# Patient Record
Sex: Female | Born: 1990 | Race: Black or African American | Hispanic: No | Marital: Single | Smoking: Current every day smoker
Health system: Southern US, Community
[De-identification: ages and names within clinical notes are randomized; demographics above are authoritative.]

## PROBLEM LIST (undated history)

## (undated) ENCOUNTER — Emergency Department (HOSPITAL_COMMUNITY): Payer: No Typology Code available for payment source | Source: Home / Self Care

## (undated) DIAGNOSIS — Z5189 Encounter for other specified aftercare: Secondary | ICD-10-CM

## (undated) DIAGNOSIS — F419 Anxiety disorder, unspecified: Secondary | ICD-10-CM

## (undated) DIAGNOSIS — D649 Anemia, unspecified: Secondary | ICD-10-CM

## (undated) HISTORY — DX: Anxiety disorder, unspecified: F41.9

## (undated) HISTORY — DX: Encounter for other specified aftercare: Z51.89

---

## 2008-05-01 ENCOUNTER — Emergency Department (HOSPITAL_COMMUNITY): Admission: EM | Admit: 2008-05-01 | Discharge: 2008-05-01 | Payer: Self-pay | Admitting: Emergency Medicine

## 2008-09-20 ENCOUNTER — Emergency Department (HOSPITAL_COMMUNITY): Admission: EM | Admit: 2008-09-20 | Discharge: 2008-09-20 | Payer: Self-pay | Admitting: Emergency Medicine

## 2008-10-04 ENCOUNTER — Emergency Department (HOSPITAL_COMMUNITY): Admission: EM | Admit: 2008-10-04 | Discharge: 2008-10-04 | Payer: Self-pay | Admitting: Emergency Medicine

## 2009-05-31 ENCOUNTER — Emergency Department (HOSPITAL_COMMUNITY): Admission: EM | Admit: 2009-05-31 | Discharge: 2009-05-31 | Payer: Self-pay | Admitting: Emergency Medicine

## 2009-10-19 ENCOUNTER — Emergency Department (HOSPITAL_COMMUNITY): Admission: EM | Admit: 2009-10-19 | Discharge: 2009-10-19 | Payer: Self-pay | Admitting: Emergency Medicine

## 2010-01-26 ENCOUNTER — Emergency Department (HOSPITAL_COMMUNITY): Admission: EM | Admit: 2010-01-26 | Discharge: 2009-03-25 | Payer: Self-pay | Admitting: Emergency Medicine

## 2010-05-05 LAB — POCT PREGNANCY, URINE: Preg Test, Ur: NEGATIVE

## 2010-05-05 LAB — WET PREP, GENITAL: Trich, Wet Prep: NONE SEEN

## 2010-05-05 LAB — GC/CHLAMYDIA PROBE AMP, GENITAL: GC Probe Amp, Genital: NEGATIVE

## 2010-05-10 LAB — RAPID STREP SCREEN (MED CTR MEBANE ONLY): Streptococcus, Group A Screen (Direct): POSITIVE — AB

## 2010-05-27 LAB — GC/CHLAMYDIA PROBE AMP, GENITAL
Chlamydia, DNA Probe: POSITIVE — AB
GC Probe Amp, Genital: POSITIVE — AB

## 2010-05-27 LAB — WET PREP, GENITAL
Clue Cells Wet Prep HPF POC: NONE SEEN
Trich, Wet Prep: NONE SEEN
Yeast Wet Prep HPF POC: NONE SEEN

## 2010-05-27 LAB — URINALYSIS, ROUTINE W REFLEX MICROSCOPIC
Glucose, UA: NEGATIVE mg/dL
Glucose, UA: NEGATIVE mg/dL
Ketones, ur: NEGATIVE mg/dL
Nitrite: NEGATIVE
Protein, ur: NEGATIVE mg/dL
Specific Gravity, Urine: 1.013 (ref 1.005–1.030)
Urobilinogen, UA: 1 mg/dL (ref 0.0–1.0)
pH: 6.5 (ref 5.0–8.0)

## 2010-05-27 LAB — POCT PREGNANCY, URINE
Preg Test, Ur: NEGATIVE
Preg Test, Ur: NEGATIVE

## 2010-06-01 LAB — URINALYSIS, ROUTINE W REFLEX MICROSCOPIC
Bilirubin Urine: NEGATIVE
Hgb urine dipstick: NEGATIVE
Ketones, ur: 15 mg/dL — AB
Protein, ur: NEGATIVE mg/dL
Urobilinogen, UA: 1 mg/dL (ref 0.0–1.0)

## 2010-06-01 LAB — POCT PREGNANCY, URINE: Preg Test, Ur: NEGATIVE

## 2010-06-01 LAB — URINE MICROSCOPIC-ADD ON

## 2011-03-01 ENCOUNTER — Inpatient Hospital Stay (HOSPITAL_COMMUNITY)
Admission: AD | Admit: 2011-03-01 | Discharge: 2011-03-01 | Disposition: A | Discharge status: Home / Self Care | Payer: Medicaid Other | Source: Ambulatory Visit | Attending: Obstetrics & Gynecology | Admitting: Obstetrics & Gynecology

## 2011-03-01 ENCOUNTER — Encounter (HOSPITAL_COMMUNITY): Payer: Self-pay | Admitting: *Deleted

## 2011-03-01 DIAGNOSIS — N949 Unspecified condition associated with female genital organs and menstrual cycle: Secondary | ICD-10-CM | POA: Insufficient documentation

## 2011-03-01 DIAGNOSIS — N938 Other specified abnormal uterine and vaginal bleeding: Secondary | ICD-10-CM | POA: Insufficient documentation

## 2011-03-01 DIAGNOSIS — A499 Bacterial infection, unspecified: Secondary | ICD-10-CM | POA: Insufficient documentation

## 2011-03-01 DIAGNOSIS — N898 Other specified noninflammatory disorders of vagina: Secondary | ICD-10-CM

## 2011-03-01 DIAGNOSIS — N760 Acute vaginitis: Secondary | ICD-10-CM | POA: Insufficient documentation

## 2011-03-01 DIAGNOSIS — N939 Abnormal uterine and vaginal bleeding, unspecified: Secondary | ICD-10-CM

## 2011-03-01 DIAGNOSIS — B9689 Other specified bacterial agents as the cause of diseases classified elsewhere: Secondary | ICD-10-CM | POA: Insufficient documentation

## 2011-03-01 HISTORY — DX: Anemia, unspecified: D64.9

## 2011-03-01 LAB — CBC
HCT: 38.5 % (ref 36.0–46.0)
Hemoglobin: 12.7 g/dL (ref 12.0–15.0)
MCV: 82.8 fL (ref 78.0–100.0)
Platelets: 335 10*3/uL (ref 150–400)
RBC: 4.65 MIL/uL (ref 3.87–5.11)
WBC: 13 10*3/uL — ABNORMAL HIGH (ref 4.0–10.5)

## 2011-03-01 LAB — URINALYSIS, ROUTINE W REFLEX MICROSCOPIC
Glucose, UA: NEGATIVE mg/dL
Ketones, ur: NEGATIVE mg/dL
Leukocytes, UA: NEGATIVE
Protein, ur: NEGATIVE mg/dL

## 2011-03-01 LAB — WET PREP, GENITAL

## 2011-03-01 LAB — URINE MICROSCOPIC-ADD ON

## 2011-03-01 MED ORDER — METRONIDAZOLE 500 MG PO TABS: 500.0000 mg | ORAL_TABLET | Freq: Two times a day (BID) | ORAL | Status: AC

## 2011-03-01 NOTE — Progress Notes (Signed)
Patient state she has been on Depo for 6 years and has not had a period in that time. Next injection due 1-18. Started bleeding on 1-3 and has had moderate bleeding every day since. Wears tampons and changes about 6 times a day and has some moderate clots. Started having abdominal cramping about the same time as the bleeding started.

## 2011-03-01 NOTE — ED Provider Notes (Signed)
History   Pt presents today c/o irregular vag bleeding. She states she has been on Depo-provera for the past 5 years or so and has not had menses during that time. However, she began bleeding on 02/22/11 and has continued to have bleeding "on and off." She also c/o some irregular cramping. She denies vag irritation, dc, fever. She denies recent intercourse.  Chief Complaint  Patient presents with  . Vaginal Bleeding  . Abdominal Pain   HPI  OB History    Grav Para Term Preterm Abortions TAB SAB Ect Mult Living   3 3 1 2      3       Past Medical History  Diagnosis Date  . Anemia     Past Surgical History  Procedure Date  . Cesarean section     No family history on file.  History  Substance Use Topics  . Smoking status: Current Everyday Smoker -- 0.2 packs/day for 9 years    Types: Cigarettes  . Smokeless tobacco: Not on file  . Alcohol Use: Yes     occasional alcohol    Allergies: No Known Allergies  Prescriptions prior to admission  Medication Sig Dispense Refill  . acetaminophen (TYLENOL) 325 MG tablet Take 650 mg by mouth daily as needed. For pain      . Ibuprofen (MIDOL) 200 MG CAPS Take 1 capsule by mouth daily as needed. For cramps      . Multiple Vitamin (MULITIVITAMIN WITH MINERALS) TABS Take 1 tablet by mouth daily.        Review of Systems  Constitutional: Negative for fever.  Eyes: Negative for blurred vision.  Cardiovascular: Negative for chest pain and palpitations.  Gastrointestinal: Positive for abdominal pain. Negative for nausea, vomiting, diarrhea and constipation.  Genitourinary: Negative for dysuria, urgency, frequency and hematuria.  Neurological: Negative for dizziness and headaches.  Psychiatric/Behavioral: Negative for depression and suicidal ideas.   Physical Exam   Blood pressure 124/78, pulse 84, temperature 99 F (37.2 C), temperature source Oral, resp. rate 16, height 5\' 3"  (1.6 m), weight 126 lb 6.4 oz (57.335 kg), last menstrual  period 02/22/2011, SpO2 98.00%.  Physical Exam  Nursing note and vitals reviewed. Constitutional: She is oriented to person, place, and time. She appears well-developed and well-nourished. No distress.  HENT:  Head: Normocephalic and atraumatic.  Eyes: EOM are normal. Pupils are equal, round, and reactive to light.  GI: Soft. She exhibits no distension and no mass. There is no tenderness. There is no rebound and no guarding.  Genitourinary: There is bleeding around the vagina. No vaginal discharge found.       Uterus NL size and shape. No adnexal masses. Pt slightly tender to palpation.  Neurological: She is alert and oriented to person, place, and time.  Skin: Skin is warm and dry. She is not diaphoretic.  Psychiatric: She has a normal mood and affect. Her behavior is normal. Judgment and thought content normal.    MAU Course  Procedures  Wet prep and GC/Chlamydia cultures done.  Results for orders placed during the hospital encounter of 03/01/11 (from the past 24 hour(s))  CBC     Status: Abnormal   Collection Time   03/01/11  8:57 AM      Component Value Range   WBC 13.0 (*) 4.0 - 10.5 (K/uL)   RBC 4.65  3.87 - 5.11 (MIL/uL)   Hemoglobin 12.7  12.0 - 15.0 (g/dL)   HCT 40.3  47.4 - 25.9 (%)  MCV 82.8  78.0 - 100.0 (fL)   MCH 27.3  26.0 - 34.0 (pg)   MCHC 33.0  30.0 - 36.0 (g/dL)   RDW 16.1  09.6 - 04.5 (%)   Platelets 335  150 - 400 (K/uL)  URINALYSIS, ROUTINE W REFLEX MICROSCOPIC     Status: Abnormal   Collection Time   03/01/11  9:22 AM      Component Value Range   Color, Urine YELLOW  YELLOW    APPearance CLEAR  CLEAR    Specific Gravity, Urine 1.025  1.005 - 1.030    pH 6.0  5.0 - 8.0    Glucose, UA NEGATIVE  NEGATIVE (mg/dL)   Hgb urine dipstick MODERATE (*) NEGATIVE    Bilirubin Urine NEGATIVE  NEGATIVE    Ketones, ur NEGATIVE  NEGATIVE (mg/dL)   Protein, ur NEGATIVE  NEGATIVE (mg/dL)   Urobilinogen, UA 0.2  0.0 - 1.0 (mg/dL)   Nitrite NEGATIVE  NEGATIVE     Leukocytes, UA NEGATIVE  NEGATIVE   URINE MICROSCOPIC-ADD ON     Status: Abnormal   Collection Time   03/01/11  9:22 AM      Component Value Range   Squamous Epithelial / LPF MANY (*) RARE    RBC / HPF 3-6  <3 (RBC/hpf)   Bacteria, UA RARE  RARE   POCT PREGNANCY, URINE     Status: Normal   Collection Time   03/01/11  9:28 AM      Component Value Range   Preg Test, Ur NEGATIVE    WET PREP, GENITAL     Status: Abnormal   Collection Time   03/01/11  9:50 AM      Component Value Range   Yeast, Wet Prep NONE SEEN  NONE SEEN    Trich, Wet Prep NONE SEEN  NONE SEEN    Clue Cells, Wet Prep MANY (*) NONE SEEN    WBC, Wet Prep HPF POC FEW (*) NONE SEEN      Assessment and Plan  ABN vag bleeding: bleeding likely a result of Depo-provera use. Her next injection is due on 03/15/11. Advised her to f/u with her provider.  BV: discussed with pt at length. Will tx with BV. Discussed antabuse reaction. Discussed diet, activity, risks, and precautions.  Clinton Gallant. Darelle Kings III, DrHSc, MPAS, PA-C  03/01/2011, 9:53 AM   Henrietta Hoover, PA 03/01/11 [id number]

## 2011-03-02 LAB — GC/CHLAMYDIA PROBE AMP, GENITAL: GC Probe Amp, Genital: NEGATIVE

## 2011-03-07 NOTE — ED Provider Notes (Signed)
Agree with above note.  Chael Urenda H. 03/07/2011 7:57 AM

## 2011-09-09 ENCOUNTER — Emergency Department (HOSPITAL_COMMUNITY)
Admission: EM | Admit: 2011-09-09 | Discharge: 2011-09-09 | Disposition: A | Discharge status: Home / Self Care | Payer: Medicaid Other | Attending: Emergency Medicine | Admitting: Emergency Medicine

## 2011-09-09 ENCOUNTER — Encounter (HOSPITAL_COMMUNITY): Payer: Self-pay | Admitting: *Deleted

## 2011-09-09 DIAGNOSIS — N938 Other specified abnormal uterine and vaginal bleeding: Secondary | ICD-10-CM | POA: Insufficient documentation

## 2011-09-09 DIAGNOSIS — N949 Unspecified condition associated with female genital organs and menstrual cycle: Secondary | ICD-10-CM | POA: Insufficient documentation

## 2011-09-09 LAB — POCT I-STAT, CHEM 8
BUN: 3 mg/dL — ABNORMAL LOW (ref 6–23)
Calcium, Ion: 1.22 mmol/L (ref 1.12–1.23)
Chloride: 110 mEq/L (ref 96–112)
Creatinine, Ser: 0.9 mg/dL (ref 0.50–1.10)
Glucose, Bld: 97 mg/dL (ref 70–99)

## 2011-09-09 LAB — URINALYSIS, ROUTINE W REFLEX MICROSCOPIC
Bilirubin Urine: NEGATIVE
Glucose, UA: NEGATIVE mg/dL
Ketones, ur: NEGATIVE mg/dL
Protein, ur: NEGATIVE mg/dL
pH: 6 (ref 5.0–8.0)

## 2011-09-09 LAB — WET PREP, GENITAL
Clue Cells Wet Prep HPF POC: NONE SEEN
Trich, Wet Prep: NONE SEEN
Yeast Wet Prep HPF POC: NONE SEEN

## 2011-09-09 LAB — URINE MICROSCOPIC-ADD ON

## 2011-09-09 LAB — POCT PREGNANCY, URINE: Preg Test, Ur: NEGATIVE

## 2011-09-09 NOTE — ED Provider Notes (Signed)
History     CSN: [id number]  Arrival date & time 09/09/11  1054   First MD Initiated Contact with Patient 09/09/11 1223      Chief complaint: heavy vaginal bleeding  (Consider location/radiation/quality/duration/timing/severity/associated sxs/prior treatment) The history is provided by the patient.  pt states yesterday onset heavy vaginal bleeding and lower abd/pelvic cramping bil. Feels similar to prior menstrual cramps but states bleeding heavier. Has used 5-6 tampons today. No vaginal discharge. No fever or chills. No dysuria or flank pain. No hx fibroids, ovarian cysts, or endometriosis. Pain, crampy, comes and goes, does not radiating, no specific exacerbating or alleviating factors. Is on depo, unsure when last got. States since on depo irregular periods/bleeding, states last period/vaginal bleeding was 3-4 months ago. States prior eval at [facility] for same.     Past Medical History  Diagnosis Date  . Anemia     Past Surgical History  Procedure Date  . Cesarean section     History reviewed. No pertinent family history.  History  Substance Use Topics  . Smoking status: Current Everyday Smoker -- 1.0 packs/day for 9 years    Types: Cigarettes  . Smokeless tobacco: Current User  . Alcohol Use: Yes     occasional alcohol    OB History    Grav Para Term Preterm Abortions TAB SAB Ect Mult Living   3 3 1 2      3       Review of Systems  Constitutional: Negative for fever and chills.  Gastrointestinal: Negative for vomiting and blood in stool.  Genitourinary: Negative for dysuria, hematuria, vaginal discharge and vaginal pain.  Musculoskeletal: Negative for back pain.  Neurological: Negative for syncope and light-headedness.    Allergies  Review of patient's allergies indicates no known allergies.  Home Medications   Current Outpatient Rx  Name Route Sig Dispense Refill  . ADULT MULTIVITAMIN W/MINERALS CH Oral Take 1 tablet by mouth daily.      BP  121/75  Pulse 89  Temp 99 F (37.2 C) (Oral)  Resp 16  SpO2 98%  Physical Exam  Nursing note and vitals reviewed. Constitutional: She appears well-developed and well-nourished. No distress.  Eyes: Conjunctivae are normal. No scleral icterus.  Neck: Neck supple. No tracheal deviation present.  Cardiovascular: Normal rate.   Pulmonary/Chest: Effort normal. No respiratory distress.  Abdominal: Soft. Normal appearance and bowel sounds are normal. She exhibits no distension and no mass. There is no tenderness.  Musculoskeletal: She exhibits no edema.  Neurological: She is alert.  Skin: Skin is warm and dry. No rash noted.  Psychiatric: She has a normal mood and affect.    ED Course  Procedures (including critical care time)  Labs Reviewed  POCT I-STAT, CHEM 8 - Abnormal; Notable for the following:    BUN 3 (*)     All other components within normal limits  POCT PREGNANCY, URINE  URINALYSIS, ROUTINE W REFLEX MICROSCOPIC    Results for orders placed during the hospital encounter of 09/09/11  URINALYSIS, ROUTINE W REFLEX MICROSCOPIC      Component Value Range   Color, Urine YELLOW  YELLOW   APPearance CLOUDY (*) CLEAR   Specific Gravity, Urine 1.009  1.005 - 1.030   pH 6.0  5.0 - 8.0   Glucose, UA NEGATIVE  NEGATIVE mg/dL   Hgb urine dipstick LARGE (*) NEGATIVE   Bilirubin Urine NEGATIVE  NEGATIVE   Ketones, ur NEGATIVE  NEGATIVE mg/dL   Protein, ur NEGATIVE  NEGATIVE  mg/dL   Urobilinogen, UA 1.0  0.0 - 1.0 mg/dL   Nitrite NEGATIVE  NEGATIVE   Leukocytes, UA TRACE (*) NEGATIVE  POCT I-STAT, CHEM 8      Component Value Range   Sodium 143  135 - 145 mEq/L   Potassium 4.0  3.5 - 5.1 mEq/L   Chloride 110  96 - 112 mEq/L   BUN 3 (*) 6 - 23 mg/dL   Creatinine, Ser 7.84  0.50 - 1.10 mg/dL   Glucose, Bld 97  70 - 99 mg/dL   Calcium, Ion 6.96  2.95 - 1.23 mmol/L   TCO2 21  0 - 100 mmol/L   Hemoglobin 14.3  12.0 - 15.0 g/dL   HCT 28.4  13.2 - 44.0 %  POCT PREGNANCY, URINE       Component Value Range   Preg Test, Ur NEGATIVE  NEGATIVE  URINE MICROSCOPIC-ADD ON      Component Value Range   Squamous Epithelial / LPF RARE  RARE   WBC, UA 0-2  <3 WBC/hpf   RBC / HPF TOO NUMEROUS TO COUNT  <3 RBC/hpf  WET PREP, GENITAL      Component Value Range   Yeast Wet Prep HPF POC NONE SEEN  NONE SEEN   Trich, Wet Prep NONE SEEN  NONE SEEN   Clue Cells Wet Prep HPF POC NONE SEEN  NONE SEEN   WBC, Wet Prep HPF POC FEW (*) NONE SEEN     MDM  Labs sent from triage.   Reviewed nursing notes and prior charts for additional history.   Scant blood on pelvic exam, no active bleeding.   abd soft nt. hgb 14. u preg neg. Stable for d/c.        Suzi Roots, MD 09/09/11 1341
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
## 2011-09-15 ENCOUNTER — Emergency Department (HOSPITAL_COMMUNITY)
Admission: EM | Admit: 2011-09-15 | Discharge: 2011-09-15 | Disposition: A | Discharge status: Home / Self Care | Payer: No Typology Code available for payment source | Attending: Emergency Medicine | Admitting: Emergency Medicine

## 2011-09-15 ENCOUNTER — Emergency Department (HOSPITAL_COMMUNITY): Payer: No Typology Code available for payment source

## 2011-09-15 ENCOUNTER — Encounter (HOSPITAL_COMMUNITY): Payer: Self-pay | Admitting: Emergency Medicine

## 2011-09-15 DIAGNOSIS — F17200 Nicotine dependence, unspecified, uncomplicated: Secondary | ICD-10-CM | POA: Insufficient documentation

## 2011-09-15 DIAGNOSIS — M542 Cervicalgia: Secondary | ICD-10-CM | POA: Insufficient documentation

## 2011-09-15 DIAGNOSIS — S161XXA Strain of muscle, fascia and tendon at neck level, initial encounter: Secondary | ICD-10-CM

## 2011-09-15 DIAGNOSIS — T148XXA Other injury of unspecified body region, initial encounter: Secondary | ICD-10-CM

## 2011-09-15 DIAGNOSIS — S139XXA Sprain of joints and ligaments of unspecified parts of neck, initial encounter: Secondary | ICD-10-CM | POA: Insufficient documentation

## 2011-09-15 DIAGNOSIS — Y92410 Unspecified street and highway as the place of occurrence of the external cause: Secondary | ICD-10-CM | POA: Insufficient documentation

## 2011-09-15 MED ORDER — IBUPROFEN 800 MG PO TABS
800.0000 mg | ORAL_TABLET | Freq: Once | ORAL | Status: AC
  Administered 2011-09-15: 800 mg via ORAL
  Filled 2011-09-15: qty 1

## 2011-09-15 MED ORDER — METHOCARBAMOL 500 MG PO TABS: 500.0000 mg | ORAL_TABLET | Freq: Two times a day (BID) | ORAL | Status: AC

## 2011-09-15 MED ORDER — IBUPROFEN 600 MG PO TABS: 600.0000 mg | ORAL_TABLET | Freq: Four times a day (QID) | ORAL | Status: AC | PRN

## 2011-09-15 MED ORDER — METOCLOPRAMIDE HCL 5 MG/ML IJ SOLN
10.0000 mg | Freq: Once | INTRAMUSCULAR | Status: AC
  Administered 2011-09-15: 10 mg via INTRAMUSCULAR
  Filled 2011-09-15: qty 2

## 2011-09-15 NOTE — ED Notes (Signed)
Patient given discharge instructions, information, prescriptions, and diet order. Patient states that they adequately understand discharge information given and to return to ED if symptoms return or worsen.     
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
## 2011-09-15 NOTE — ED Notes (Signed)
[place] collar on patient at this time.
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
## 2011-09-20 ENCOUNTER — Encounter (HOSPITAL_COMMUNITY): Payer: Self-pay | Admitting: Family Medicine

## 2011-09-20 ENCOUNTER — Emergency Department (HOSPITAL_COMMUNITY)
Admission: EM | Admit: 2011-09-20 | Discharge: 2011-09-20 | Disposition: A | Discharge status: Home / Self Care | Payer: No Typology Code available for payment source | Attending: Emergency Medicine | Admitting: Emergency Medicine

## 2011-09-20 DIAGNOSIS — M549 Dorsalgia, unspecified: Secondary | ICD-10-CM

## 2011-09-20 DIAGNOSIS — F17200 Nicotine dependence, unspecified, uncomplicated: Secondary | ICD-10-CM | POA: Insufficient documentation

## 2011-09-20 DIAGNOSIS — D649 Anemia, unspecified: Secondary | ICD-10-CM | POA: Insufficient documentation

## 2011-09-20 MED ORDER — DIAZEPAM 5 MG PO TABS
5.0000 mg | ORAL_TABLET | Freq: Once | ORAL | Status: AC
  Administered 2011-09-20: 5 mg via ORAL
  Filled 2011-09-20: qty 1

## 2011-09-20 MED ORDER — HYDROCODONE-ACETAMINOPHEN 5-325 MG PO TABS: 1.0000 | ORAL_TABLET | ORAL | Status: AC | PRN

## 2011-09-20 MED ORDER — CYCLOBENZAPRINE HCL 10 MG PO TABS: 10.0000 mg | ORAL_TABLET | Freq: Two times a day (BID) | ORAL | Status: AC | PRN

## 2011-09-20 MED ORDER — OXYCODONE-ACETAMINOPHEN 5-325 MG PO TABS
1.0000 | ORAL_TABLET | Freq: Once | ORAL | Status: AC
  Administered 2011-09-20: 1 via ORAL
  Filled 2011-09-20: qty 1
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
## 2013-05-26 ENCOUNTER — Emergency Department (HOSPITAL_COMMUNITY)
Admission: EM | Admit: 2013-05-26 | Discharge: 2013-05-26 | Disposition: A | Discharge status: Home / Self Care | Payer: Self-pay | Attending: Emergency Medicine | Admitting: Emergency Medicine

## 2013-05-26 ENCOUNTER — Emergency Department (HOSPITAL_COMMUNITY): Payer: Self-pay

## 2013-05-26 ENCOUNTER — Encounter (HOSPITAL_COMMUNITY): Payer: Self-pay | Admitting: Emergency Medicine

## 2013-05-26 DIAGNOSIS — S99919A Unspecified injury of unspecified ankle, initial encounter: Secondary | ICD-10-CM

## 2013-05-26 DIAGNOSIS — S39012A Strain of muscle, fascia and tendon of lower back, initial encounter: Secondary | ICD-10-CM

## 2013-05-26 DIAGNOSIS — Z9889 Other specified postprocedural states: Secondary | ICD-10-CM | POA: Insufficient documentation

## 2013-05-26 DIAGNOSIS — S335XXA Sprain of ligaments of lumbar spine, initial encounter: Secondary | ICD-10-CM | POA: Insufficient documentation

## 2013-05-26 DIAGNOSIS — M25571 Pain in right ankle and joints of right foot: Secondary | ICD-10-CM

## 2013-05-26 DIAGNOSIS — S8990XA Unspecified injury of unspecified lower leg, initial encounter: Secondary | ICD-10-CM | POA: Insufficient documentation

## 2013-05-26 DIAGNOSIS — Z862 Personal history of diseases of the blood and blood-forming organs and certain disorders involving the immune mechanism: Secondary | ICD-10-CM | POA: Insufficient documentation

## 2013-05-26 DIAGNOSIS — F17200 Nicotine dependence, unspecified, uncomplicated: Secondary | ICD-10-CM | POA: Insufficient documentation

## 2013-05-26 DIAGNOSIS — Y9389 Activity, other specified: Secondary | ICD-10-CM | POA: Insufficient documentation

## 2013-05-26 DIAGNOSIS — S99929A Unspecified injury of unspecified foot, initial encounter: Secondary | ICD-10-CM

## 2013-05-26 DIAGNOSIS — Y92410 Unspecified street and highway as the place of occurrence of the external cause: Secondary | ICD-10-CM | POA: Insufficient documentation

## 2013-05-26 MED ORDER — HYDROCODONE-ACETAMINOPHEN 5-325 MG PO TABS: 1.0000 | ORAL_TABLET | ORAL | Status: DC | PRN

## 2013-05-26 MED ORDER — IBUPROFEN 600 MG PO TABS: 600.0000 mg | ORAL_TABLET | Freq: Four times a day (QID) | ORAL | Status: DC | PRN

## 2013-05-26 NOTE — ED Notes (Signed)
Patient back from x-ray 
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
## 2013-05-28 NOTE — ED Provider Notes (Signed)
Medical screening examination/treatment/procedure(s) were performed by non-physician practitioner and as supervising physician I was immediately available for consultation/collaboration.   EKG Interpretation None        Haytham Maher T Aasiya Creasey, MD 05/28/13 0712 

## 2013-12-21 ENCOUNTER — Encounter (HOSPITAL_COMMUNITY): Payer: Self-pay | Admitting: Emergency Medicine

## 2014-07-30 ENCOUNTER — Emergency Department (HOSPITAL_COMMUNITY)
Admission: EM | Admit: 2014-07-30 | Discharge: 2014-07-31 | Disposition: A | Discharge status: Home / Self Care | Payer: Medicaid Other | Attending: Emergency Medicine | Admitting: Emergency Medicine

## 2014-07-30 DIAGNOSIS — Z720 Tobacco use: Secondary | ICD-10-CM | POA: Insufficient documentation

## 2014-07-30 DIAGNOSIS — S0101XA Laceration without foreign body of scalp, initial encounter: Secondary | ICD-10-CM

## 2014-07-30 DIAGNOSIS — Z3202 Encounter for pregnancy test, result negative: Secondary | ICD-10-CM | POA: Insufficient documentation

## 2014-07-30 DIAGNOSIS — S7001XA Contusion of right hip, initial encounter: Secondary | ICD-10-CM | POA: Insufficient documentation

## 2014-07-30 DIAGNOSIS — Y999 Unspecified external cause status: Secondary | ICD-10-CM | POA: Insufficient documentation

## 2014-07-30 DIAGNOSIS — Y939 Activity, unspecified: Secondary | ICD-10-CM | POA: Insufficient documentation

## 2014-07-30 DIAGNOSIS — S0102XA Laceration with foreign body of scalp, initial encounter: Secondary | ICD-10-CM | POA: Insufficient documentation

## 2014-07-30 DIAGNOSIS — Y92009 Unspecified place in unspecified non-institutional (private) residence as the place of occurrence of the external cause: Secondary | ICD-10-CM | POA: Insufficient documentation

## 2014-07-30 DIAGNOSIS — Z862 Personal history of diseases of the blood and blood-forming organs and certain disorders involving the immune mechanism: Secondary | ICD-10-CM | POA: Insufficient documentation

## 2014-07-30 MED ORDER — OXYCODONE-ACETAMINOPHEN 5-325 MG PO TABS
2.0000 | ORAL_TABLET | Freq: Once | ORAL | Status: AC
  Administered 2014-07-31: 2 via ORAL
  Filled 2014-07-30: qty 2
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
## 2014-07-30 NOTE — ED Notes (Signed)
Bed: WTR5 Expected date:  Expected time:  Means of arrival:  Comments: 

## 2014-07-31 ENCOUNTER — Encounter (HOSPITAL_COMMUNITY): Payer: Self-pay | Admitting: Emergency Medicine

## 2014-07-31 ENCOUNTER — Emergency Department (HOSPITAL_COMMUNITY): Payer: Medicaid Other

## 2014-07-31 LAB — CBC WITH DIFFERENTIAL/PLATELET
BASOS PCT: 0 % (ref 0–1)
Basophils Absolute: 0 10*3/uL (ref 0.0–0.1)
EOS PCT: 1 % (ref 0–5)
Eosinophils Absolute: 0.1 10*3/uL (ref 0.0–0.7)
HCT: 35.9 % — ABNORMAL LOW (ref 36.0–46.0)
HEMOGLOBIN: 11.5 g/dL — AB (ref 12.0–15.0)
LYMPHS ABS: 3.1 10*3/uL (ref 0.7–4.0)
Lymphocytes Relative: 24 % (ref 12–46)
MCH: 26.7 pg (ref 26.0–34.0)
MCHC: 32 g/dL (ref 30.0–36.0)
MCV: 83.5 fL (ref 78.0–100.0)
Monocytes Absolute: 0.8 10*3/uL (ref 0.1–1.0)
Monocytes Relative: 7 % (ref 3–12)
NEUTROS ABS: 8.9 10*3/uL — AB (ref 1.7–7.7)
Neutrophils Relative %: 68 % (ref 43–77)
PLATELETS: 362 10*3/uL (ref 150–400)
RBC: 4.3 MIL/uL (ref 3.87–5.11)
RDW: 13.9 % (ref 11.5–15.5)
WBC: 12.9 10*3/uL — ABNORMAL HIGH (ref 4.0–10.5)

## 2014-07-31 LAB — I-STAT BETA HCG BLOOD, ED (MC, WL, AP ONLY): I-stat hCG, quantitative: <5 m[IU]/mL (ref <5)

## 2014-07-31 MED ORDER — IBUPROFEN 200 MG PO TABS
600.0000 mg | ORAL_TABLET | Freq: Once | ORAL | Status: AC
  Administered 2014-07-31: 600 mg via ORAL

## 2014-07-31 MED ORDER — OXYCODONE-ACETAMINOPHEN 5-325 MG PO TABS: 1.0000 | ORAL_TABLET | Freq: Four times a day (QID) | ORAL | Status: DC | PRN

## 2014-07-31 MED ORDER — HYDROGEN PEROXIDE 3 % EX SOLN
CUTANEOUS | Status: AC
  Filled 2014-07-31: qty 473

## 2014-07-31 MED ORDER — IBUPROFEN 600 MG PO TABS: 600.0000 mg | ORAL_TABLET | Freq: Four times a day (QID) | ORAL | Status: AC | PRN

## 2014-07-31 MED ORDER — LIDOCAINE-EPINEPHRINE-TETRACAINE (LET) SOLUTION
3.0000 mL | Freq: Once | NASAL | Status: AC
  Administered 2014-07-31: 3 mL via TOPICAL
  Filled 2014-07-31: qty 3
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
## 2014-08-08 ENCOUNTER — Emergency Department (HOSPITAL_COMMUNITY): Payer: Self-pay

## 2014-08-08 ENCOUNTER — Emergency Department (HOSPITAL_COMMUNITY): Payer: Medicaid Other

## 2014-08-08 ENCOUNTER — Encounter (HOSPITAL_COMMUNITY): Payer: Self-pay | Admitting: *Deleted

## 2014-08-08 ENCOUNTER — Emergency Department (HOSPITAL_COMMUNITY)
Admission: EM | Admit: 2014-08-08 | Discharge: 2014-08-08 | Disposition: A | Discharge status: Home / Self Care | Payer: Medicaid Other | Attending: Emergency Medicine | Admitting: Emergency Medicine

## 2014-08-08 DIAGNOSIS — W108XXA Fall (on) (from) other stairs and steps, initial encounter: Secondary | ICD-10-CM | POA: Insufficient documentation

## 2014-08-08 DIAGNOSIS — M545 Low back pain, unspecified: Secondary | ICD-10-CM

## 2014-08-08 DIAGNOSIS — Y998 Other external cause status: Secondary | ICD-10-CM | POA: Insufficient documentation

## 2014-08-08 DIAGNOSIS — S060X1A Concussion with loss of consciousness of 30 minutes or less, initial encounter: Secondary | ICD-10-CM | POA: Insufficient documentation

## 2014-08-08 DIAGNOSIS — S3992XA Unspecified injury of lower back, initial encounter: Secondary | ICD-10-CM | POA: Insufficient documentation

## 2014-08-08 DIAGNOSIS — Z862 Personal history of diseases of the blood and blood-forming organs and certain disorders involving the immune mechanism: Secondary | ICD-10-CM | POA: Insufficient documentation

## 2014-08-08 DIAGNOSIS — Y9389 Activity, other specified: Secondary | ICD-10-CM | POA: Insufficient documentation

## 2014-08-08 DIAGNOSIS — Z720 Tobacco use: Secondary | ICD-10-CM | POA: Insufficient documentation

## 2014-08-08 DIAGNOSIS — Y9289 Other specified places as the place of occurrence of the external cause: Secondary | ICD-10-CM | POA: Insufficient documentation

## 2014-08-08 DIAGNOSIS — Z4802 Encounter for removal of sutures: Secondary | ICD-10-CM | POA: Insufficient documentation

## 2014-08-08 MED ORDER — TRAMADOL HCL 50 MG PO TABS: 50.0000 mg | ORAL_TABLET | Freq: Four times a day (QID) | ORAL | Status: DC | PRN

## 2014-08-08 MED ORDER — TRAMADOL HCL 50 MG PO TABS
50.0000 mg | ORAL_TABLET | Freq: Once | ORAL | Status: AC
  Administered 2014-08-08: 50 mg via ORAL
  Filled 2014-08-08: qty 1
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
## 2014-08-08 NOTE — ED Notes (Signed)
Declined W/C at D/C and was escorted to lobby by RN. 

## 2014-08-08 NOTE — Discharge Instructions (Signed)
Back Pain, Adult °Low back pain is very common. About 1 in 5 people have back pain. The cause of low back pain is rarely dangerous. The pain often gets better over time. About half of people with a sudden onset of back pain feel better in just 2 weeks. About 8 in 10 people feel better by 6 weeks.  °CAUSES °Some common causes of back pain include: °· Strain of the muscles or ligaments supporting the spine. °· Wear and tear (degeneration) of the spinal discs. °· Arthritis. °· Direct injury to the back. °DIAGNOSIS °Most of the time, the direct cause of low back pain is not known. However, back pain can be treated effectively even when the exact cause of the pain is unknown. Answering your caregiver's questions about your overall health and symptoms is one of the most accurate ways to make sure the cause of your pain is not dangerous. If your caregiver needs more information, he or she may order lab work or imaging tests (X-rays or MRIs). However, even if imaging tests show changes in your back, this usually does not require surgery. °HOME CARE INSTRUCTIONS °For many people, back pain returns. Since low back pain is rarely dangerous, it is often a condition that people can learn to manage on their own.  °· Remain active. It is stressful on the back to sit or stand in one place. Do not sit, drive, or stand in one place for more than 30 minutes at a time. Take short walks on level surfaces as soon as pain allows. Try to increase the length of time you walk each day. °· Do not stay in bed. Resting more than 1 or 2 days can delay your recovery. °· Do not avoid exercise or work. Your body is made to move. It is not dangerous to be active, even though your back may hurt. Your back will likely heal faster if you return to being active before your pain is gone. °· Pay attention to your body when you  bend and lift. Many people have less discomfort when lifting if they bend their knees, keep the load close to their bodies, and  avoid twisting. Often, the most comfortable positions are those that put less stress on your recovering back. °· Find a comfortable position to sleep. Use a firm mattress and lie on your side with your knees slightly bent. If you lie on your back, put a pillow under your knees. °· Only take over-the-counter or prescription medicines as directed by your caregiver. Over-the-counter medicines to reduce pain and inflammation are often the most helpful. Your caregiver may prescribe muscle relaxant drugs. These medicines help dull your pain so you can more quickly return to your normal activities and healthy exercise. °· Put ice on the injured area. °¨ Put ice in a plastic bag. °¨ Place a towel between your skin and the bag. °¨ Leave the ice on for 15-20 minutes, 03-04 times a day for the first 2 to 3 days. After that, ice and heat may be alternated to reduce pain and spasms. °· Ask your caregiver about trying back exercises and gentle massage. This may be of some benefit. °· Avoid feeling anxious or stressed. Stress increases muscle tension and can worsen back pain. It is important to recognize when you are anxious or stressed and learn ways to manage it. Exercise is a great option. °SEEK MEDICAL CARE IF: °· You have pain that is not relieved with rest or medicine. °· You have pain that does not improve in 1 week. °· You have new symptoms. °· You are generally not feeling well. °SEEK   IMMEDIATE MEDICAL CARE IF:   You have pain that radiates from your back into your legs.  You develop new bowel or bladder control problems.  You have unusual weakness or numbness in your arms or legs.  You develop nausea or vomiting.  You develop abdominal pain.  You feel faint. Document Released: 02/05/2005 Document Revised: 08/07/2011 Document Reviewed: 06/09/2013 [facility] Patient Information 2015 [place]. This information is not intended to replace advice given to you by your health care provider. Make sure you  discuss any questions you have with your health care provider. Concussion A concussion, or closed-head injury, is a brain injury caused by a direct blow to the head or by a quick and sudden movement (jolt) of the head or neck. Concussions are usually not life-threatening. Even so, the effects of a concussion can be serious. If you have had a concussion before, you are more likely to experience concussion-like symptoms after a direct blow to the head.  CAUSES  Direct blow to the head, such as from running into another player during a soccer game, being hit in a fight, or hitting your head on a hard surface.  A jolt of the head or neck that causes the brain to move back and forth inside the skull, such as in a car crash. SIGNS AND SYMPTOMS The signs of a concussion can be hard to notice. Early on, they may be missed by you, family members, and health care providers. You may look fine but act or feel differently. Symptoms are usually temporary, but they may last for days, weeks, or even longer. Some symptoms may appear right away while others may not show up for hours or days. Every head injury is different. Symptoms include:  Mild to moderate headaches that will not go away.  A feeling of pressure inside your head.  Having more trouble than usual:  Learning or remembering things you have heard.  Answering questions.  Paying attention or concentrating.  Organizing daily tasks.  Making decisions and solving problems.  Slowness in thinking, acting or reacting, speaking, or reading.  Getting lost or being easily confused.  Feeling tired all the time or lacking energy (fatigued).  Feeling drowsy.  Sleep disturbances.  Sleeping more than usual.  Sleeping less than usual.  Trouble falling asleep.  Trouble sleeping (insomnia).  Loss of balance or feeling lightheaded or dizzy.  Nausea or vomiting.  Numbness or tingling.  Increased sensitivity  to:  Sounds.  Lights.  Distractions.  Vision problems or eyes that tire easily.  Diminished sense of taste or smell.  Ringing in the ears.  Mood changes such as feeling sad or anxious.  Becoming easily irritated or angry for little or no reason.  Lack of motivation.  Seeing or hearing things other people do not see or hear (hallucinations). DIAGNOSIS Your health care provider can usually diagnose a concussion based on a description of your injury and symptoms. He or she will ask whether you passed out (lost consciousness) and whether you are having trouble remembering events that happened right before and during your injury. Your evaluation might include:  A brain scan to look for signs of injury to the brain. Even if the test shows no injury, you may still have a concussion.  Blood tests to be sure other problems are not present. TREATMENT  Concussions are usually treated in an emergency department, in urgent care, or at a clinic. You may need to stay in the hospital overnight for further treatment.  Tell your health care provider if you are taking any medicines, including prescription medicines, over-the-counter medicines, and natural remedies. Some medicines, such as blood thinners (anticoagulants) and aspirin, may increase the chance of complications. Also tell your health care provider whether you have had alcohol or are taking illegal drugs. This information may affect treatment.  Your health care provider will send you home with important instructions to follow.  How fast you will recover from a concussion depends on many factors. These factors include how severe your concussion is, what part of your brain was injured, your age, and how healthy you were before the concussion.  Most people with mild injuries recover fully. Recovery can take time. In general, recovery is slower in older persons. Also, persons who have had a concussion in the past or have other medical  problems may find that it takes longer to recover from their current injury. HOME CARE INSTRUCTIONS General Instructions  Carefully follow the directions your health care provider gave you.  Only take over-the-counter or prescription medicines for pain, discomfort, or fever as directed by your health care provider.  Take only those medicines that your health care provider has approved.  Do not drink alcohol until your health care provider says you are well enough to do so. Alcohol and certain other drugs may slow your recovery and can put you at risk of further injury.  If it is harder than usual to remember things, write them down.  If you are easily distracted, try to do one thing at a time. For example, do not try to watch TV while fixing dinner.  Talk with family members or close friends when making important decisions.  Keep all follow-up appointments. Repeated evaluation of your symptoms is recommended for your recovery.  Watch your symptoms and tell others to do the same. Complications sometimes occur after a concussion. Older adults with a brain injury may have a higher risk of serious complications, such as a blood clot on the brain.  Tell your teachers, school nurse, school counselor, coach, athletic trainer, or work Freight forwarder about your injury, symptoms, and restrictions. Tell them about what you can or cannot do. They should watch for:  Increased problems with attention or concentration.  Increased difficulty remembering or learning new information.  Increased time needed to complete tasks or assignments.  Increased irritability or decreased ability to cope with stress.  Increased symptoms.  Rest. Rest helps the brain to heal. Make sure you:  Get plenty of sleep at night. Avoid staying up late at night.  Keep the same bedtime hours on weekends and weekdays.  Rest during the day. Take daytime naps or rest breaks when you feel tired.  Limit activities that require a  lot of thought or concentration. These include:  Doing homework or job-related work.  Watching TV.  Working on the computer.  Avoid any situation where there is potential for another head injury (football, hockey, soccer, basketball, martial arts, downhill snow sports and horseback riding). Your condition will get worse every time you experience a concussion. You should avoid these activities until you are evaluated by the appropriate follow-up health care providers. Returning To Your Regular Activities You will need to return to your normal activities slowly, not all at once. You must give your body and brain enough time for recovery.  Do not return to sports or other athletic activities until your health care provider tells you it is safe to do so.  Ask your health care provider when  you can drive, ride a bicycle, or operate heavy machinery. Your ability to react may be slower after a brain injury. Never do these activities if you are dizzy.  Ask your health care provider about when you can return to work or school. Preventing Another Concussion It is very important to avoid another brain injury, especially before you have recovered. In rare cases, another injury can lead to permanent brain damage, brain swelling, or death. The risk of this is greatest during the first 7-10 days after a head injury. Avoid injuries by:  Wearing a seat belt when riding in a car.  Drinking alcohol only in moderation.  Wearing a helmet when biking, skiing, skateboarding, skating, or doing similar activities.  Avoiding activities that could lead to a second concussion, such as contact or recreational sports, until your health care provider says it is okay.  Taking safety measures in your home.  Remove clutter and tripping hazards from floors and stairways.  Use grab bars in bathrooms and handrails by stairs.  Place non-slip mats on floors and in bathtubs.  Improve lighting in dim areas. SEEK MEDICAL  CARE IF:  You have increased problems paying attention or concentrating.  You have increased difficulty remembering or learning new information.  You need more time to complete tasks or assignments than before.  You have increased irritability or decreased ability to cope with stress.  You have more symptoms than before. Seek medical care if you have any of the following symptoms for more than 2 weeks after your injury:  Lasting (chronic) headaches.  Dizziness or balance problems.  Nausea.  Vision problems.  Increased sensitivity to noise or light.  Depression or mood swings.  Anxiety or irritability.  Memory problems.  Difficulty concentrating or paying attention.  Sleep problems.  Feeling tired all the time. SEEK IMMEDIATE MEDICAL CARE IF:  You have severe or worsening headaches. These may be a sign of a blood clot in the brain.  You have weakness (even if only in one hand, leg, or part of the face).  You have numbness.  You have decreased coordination.  You vomit repeatedly.  You have increased sleepiness.  One pupil is larger than the other.  You have convulsions.  You have slurred speech.  You have increased confusion. This may be a sign of a blood clot in the brain.  You have increased restlessness, agitation, or irritability.  You are unable to recognize people or places.  You have neck pain.  It is difficult to wake you up.  You have unusual behavior changes.  You lose consciousness. MAKE SURE YOU:  Understand these instructions.  Will watch your condition.  Will get help right away if you are not doing well or get worse. Document Released: 04/28/2003 Document Revised: 02/10/2013 Document Reviewed: 08/28/2012 [facility] Patient Information 2015 [place], [place]. This information is not intended to replace advice given to you by your health care provider. Make sure you discuss any questions you have with your health care  provider.  Emergency Department Resource Guide 1) Find a Doctor and Pay Out of Pocket Although you won't have to find out who is covered by your insurance plan, it is a good idea to ask around and get recommendations. You will then need to call the office and see if the doctor you have chosen will accept you as a new patient and what types of options they offer for patients who are self-pay. Some doctors offer discounts or will set up payment plans  for their patients who do not have insurance, but you will need to ask so you aren't surprised when you get to your appointment.  2) Contact Your Local Health Department Not all health departments have doctors that can see patients for sick visits, but many do, so it is worth a call to see if yours does. If you don't know where your local health department is, you can check in your phone book. The CDC also has a tool to help you locate your state's health department, and many state websites also have listings of all of their local health departments.  3) Find a Walk-in Clinic If your illness is not likely to be very severe or complicated, you may want to try a walk in clinic. These are popping up all over the country in pharmacies, drugstores, and shopping centers. They're usually staffed by nurse practitioners or physician assistants that have been trained to treat common illnesses and complaints. They're usually fairly quick and inexpensive. However, if you have serious medical issues or chronic medical problems, these are probably not your best option.  No Primary Care Doctor: - Call Health Connect at  [phone] - they can help you locate a primary care doctor that  accepts your insurance, provides certain services, etc. - Physician Referral Service- [phone]  Chronic Pain Problems: Organization         Address  Phone   Notes  [facility]  [phone] Patients need to be referred by their primary care doctor.   Medication  Assistance: Organization         Address  Phone   Notes  [facility] Medication [facility] [address] [place] [place], [place] [address] [phone] --Must be a resident of [facility] -- Must have NO insurance coverage whatsoever (no Medicaid/ Medicare, etc.) -- The pt. MUST have a primary care doctor that directs their care regularly and follows them in the community   [facility]  [phone]   [employer]  [phone]    Agencies that provide inexpensive medical care: Organization         Address  Phone   Notes  [facility]  [phone]   [facility]    [phone]   [facility] [address] [place], [place] [address] [phone]   [facility] [address] [place], [place] [phone]   [facility]    [phone]   [facility]    [phone]   [facility]  [address], [place] Phone:  [phone], Fax:  [phone] Hours of Operation:  9 am - 6 pm, M-F.  Also accepts Medicaid/Medicare and self-pay.  [facility]  [address], [place] Phone: [phone], Fax: [phone]. Hours of Operation:  8:30 am - 5:30 pm, M-F.  Also accepts Medicaid and self-pay.  [facility] [place] [address] [place] Phone: [phone]   [facility] [address] Natasha Bence [place], [place] [phone], Ext. [phone] Mondays & Thursdays: 7-9 AM.  First 15 patients are seen on a first come, first serve basis.    Medicaid-accepting [facility] Providers:  Organization         Address  Phone   Notes  [facility] [address], [place] [phone] Also accepts self-pay patients.  [facility] [address] Laurell Josephs [place], [place]  [phone]   [facility] [address], [place]  [phone]   [facility] [address], [place] [phone]   Renaye Rakers [address], [place]   [phone] Only accepts [place] patients after they have their name applied to their card.   Self-Pay (no insurance) in [facility]:  Organization         Address  Phone   Notes  Sickle Cell Patients, [facility] Internal Medicine [address] [place], [place] [phone]   [facility] [address] [place], [place] [phone]   [facility]  [address], [place] [phone]   [facility]/Dr. Osei-Bonsu  [address], [place] or [address], [place] [phone] Phone number for both [place] and [place] locations is the same.  [facility] [address], [place] [phone]   [facility] [address], [place] or [address] [phone]   [facility] [address], [place] [phone], phone; [phone], fax Sees patients 1st and 3rd Saturday of every month.  Must not qualify for public or private insurance (i.e. Medicaid, Medicare, [place] Health Choice, Veterans' Benefits)  Household income should be no more than 200% of the poverty level The clinic cannot treat you if you are pregnant or think you are pregnant  Sexually transmitted diseases are not treated at the clinic.    Dental Care: Organization         Address  Phone  Notes  [facility] Department of [facility] [address] [place], [place] [phone] Accepts children up to age 19 who are enrolled in [place] or [place] Health Choice; pregnant women with a Medicaid card; and children who have applied for Medicaid or [place] Health Choice, but were declined, whose parents can pay a reduced fee at time of service.  [facility]  [address], [place] [phone] Accepts children up to age 67 who are enrolled in [place] or [place] Health Choice; pregnant women with a Medicaid card; and children who have applied for Medicaid or [place] Health Choice, but were declined, whose parents can pay a reduced fee at time of service.  [facility]  [address] [place], [place] [phone] Patients are seen by appointment only. Walk-ins are not accepted. [facility] will see patients 63 years of age and older. Monday - Tuesday (8am-5pm) Most Wednesdays (8:30-5pm) $30 per visit, cash only  [facility]  [address], [facility] [phone] Patients are seen by appointment only. Walk-ins are not accepted. [facility] will see patients 80 years of age and older. One Wednesday Evening (Monthly: Volunteer Based).  $30 per visit, cash only  [employer]  [phone] for adults; Children under age 29, call Graduate Pediatric Dentistry at [phone]. Children aged 73-14, please call [phone] to request a pediatric application.  Dental services are provided in all areas of dental care including fillings, crowns and bridges, complete and partial dentures, implants, gum treatment, root canals, and extractions. Preventive care is also provided. Treatment is provided to both adults  and children. Patients are selected via a lottery and there is often a waiting list.   [facility] [address], [place]  [phone] [email or web address]   [facility] [address] [place], [place] [phone], Ext. [phone] Second and Fourth Thursday of each month, opens at 6:30 AM; Clinic ends at 9 AM.  Patients are seen on a first-come first-served basis, and a limited number are seen during each clinic.   [facility]  [address] Ether [place]ins [place], [place] [phone]    Eligibility Requirements You must have lived in [place], [place], or [place] counties for at least the last three months.   You cannot be eligible for state or federal sponsored [place], including [employer], [place], or [employer].   You generally cannot be eligible for healthcare insurance through your employer.    How to apply: Eligibility screenings are held every Tuesday and Wednesday afternoon from 1:00 pm until 4:00 pm. You do not need an appointment for the interview!  [facility] [address] [place], [place] [phone]   [facility] Health Department  [phone]   [facility] Health Department  [phone]   [facility]  [phone]    Behavioral Health Resources in the Community: Intensive Outpatient Programs Organization         Address  Phone  Notes  [facility] [address] [place], [place] [phone]   [facility] [address] [place], [place] [phone]   [facility] [address], [place], [place]  [phone]   [facility] [address]  [place], [place] [phone] or [phone]   Substance Abuse Resources Organization         Address  Phone  Notes  [facility]  [phone]   [facility]  [phone]   [facility] [place]  [phone]   Floydene Flock  [phone]   Residential & Outpatient Substance Abuse Program  [phone]   Psychological Services Organization         Address  Phone  Notes  [facility] Behavioral Health  [phone]   [facility]  [phone]   [facility] Mental Health [address] [place] [phone] or [phone]    Mobile Crisis Teams Organization         Address  Phone  Notes  [facility], Mobile Crisis Care Unit  [phone]   [facility]  [address]  [place], [place] [phone]   Doristine Locks [address] [place] [phone]    Self-Help/Support Groups Organization         Address  Phone             Notes  [facility] - variety of support groups  [phone]- [phone] Call for more information  Narcotics Anonymous (NA), [facility] [address], [employer] [place]  2 meetings at this location   Statistician         Address  Phone  Notes  [facility] [address],    [place]  [phone]   [facility]  [address] [place] [id number], [place], [place] [phone]   [facility] [address] [place], [place] [phone] Admissions: 8am-3pm M-F  [facility] [address],    [place], [place] [phone]   [facility] [address] [place], [place], [place] [phone]   [facility] [address]  [place], [place] [phone]   [facility] [address], [place], [place] [phone]   [facility] ([facility] [address] [place]  [place], [place] [phone] or [phone]   [facility] [address], [place], [place] [phone] Accepts Medicaid  [facility] [address],  [place] [phone] Substance Abuse/Addiction Treatment   [facility] Organization         Address  Phone  Notes  [facility]  [phone]   Angie Fava, PhD [address] [place], [place]   [phone] or [phone]   [facility]   [address] [place], [place] [phone]   [facility] [address] [place], [place] [phone] Insurance/Medicaid/sponsorship through [facility] [address]                                    [place], [place] [phone] Therapy/tele-psych/case   [facility] [address], [place] [phone]    Dr. Lolly Mustache  [phone]   [facility]  [facility] 1) [address], [place] 2) [address], [place] 3)  [address], [place] [phone]  [phone]   [facility] Child Abuse Hotline 5080667640 or [phone] (After Hours)

## 2015-01-24 ENCOUNTER — Ambulatory Visit (INDEPENDENT_AMBULATORY_CARE_PROVIDER_SITE_OTHER): Payer: Self-pay | Admitting: Family Medicine

## 2015-01-24 VITALS — BP 122/80 | HR 99 | Temp 99.7°F | Resp 16 | Ht 63.0 in | Wt 115.2 lb

## 2015-01-24 DIAGNOSIS — Z30013 Encounter for initial prescription of injectable contraceptive: Secondary | ICD-10-CM

## 2015-01-24 DIAGNOSIS — F4310 Post-traumatic stress disorder, unspecified: Secondary | ICD-10-CM

## 2015-01-24 LAB — POCT URINE PREGNANCY: Preg Test, Ur: NEGATIVE

## 2015-01-24 MED ORDER — AMITRIPTYLINE HCL 50 MG PO TABS: 50.0000 mg | ORAL_TABLET | Freq: Every day | ORAL | Status: AC

## 2015-01-24 MED ORDER — MEDROXYPROGESTERONE ACETATE 150 MG/ML IM SUSP
150.0000 mg | Freq: Once | INTRAMUSCULAR | Status: AC
  Administered 2015-01-24: 150 mg via INTRAMUSCULAR
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
## 2015-01-24 NOTE — Patient Instructions (Addendum)
Please return to the office for recheck in 2 weeks   Posttraumatic Stress Disorder Posttraumatic stress disorder (PTSD) is a mental disorder. It occurs after a traumatic event in your life. The traumatic events that cause PTSD are outside the range of normal human experience. Examples of these events include war, automobile accidents, natural disasters, rape, domestic violence, and violent crimes. Most people who experience these types of events are able to heal on their own. Those who do not heal develop PTSD. PTSD can happen to anyone at any age. However, people with a history of childhood abuse are at increased risk for developing PTSD.  SYMPTOMS  The traumatic event that causes PTSD must be a threat to life, cause serious injury, or involve sexual violence. The traumatic event is usually experienced directly by the person who develops PTSD. Sometimes PTSD occurs in people who witness traumas that occur to others or who hear about a trauma that occurs to a close family member or friend. The following behaviors are characteristic of people with PTSD:  People with PTSD re-experience the traumatic event in one or more of the following ways (intrusion symptoms):  Recurrent, unwanted distressing memories while awake.  Recurrent distressing dreams.  Sensations similar to those felt when the event originally occurred (flashbacks).   Intense or prolonged emotional distress, triggered by reminders of the trauma. This may include fear, horror, intense sadness, or anger.  Marked physical reactions, triggered by reminders of the trauma. This may include racing heart, shortness of breath, sweating, and shaking.  People with PTSD avoid thoughts, conversations, people, or activities that remind them of the traumatic event (avoidance symptoms).  People with PTSD have negative changes in their thinking and mood after the traumatic event. These changes include:  Inability to remember one or more  significant aspects of the traumatic event (memory gaps).  Exaggerated negative perceptions about themselves or others, such as believing that they are bad people or that no one can be trusted.  Unrealistic assignment of blame to themselves or others for the traumatic event.  Persistent negative emotional state, such as fear, horror, anger, sadness, guilt, or shame.  Markedly decreased interest or participation in significant activities.  A loss of connection with other people.  Inability to experience positive emotions, such as happiness or love.  People with PTSD are more sensitive to their environment and react more easily than others (hyperarousal-overreactivity symptoms). These symptoms include:  Irritability, with angry outbursts toward other people or objects. The outbursts are easily triggered and may be verbal or physical.  Careless or self-destructive behavior. This may include reckless driving or drug use.  A feeling of being on edge, with increased alertness (hypervigilance).  Exaggerated reactions to stimuli, such as being easily startled.   Difficulty concentrating.  Difficulty sleeping. PTSD symptoms may start soon after a frightening event or months or years later. They last at least 1 month or longer and can affect one or more areas of functioning, such as social or occupational functioning.  DIAGNOSIS  PTSD is diagnosed through an assessment by a mental health professional. Bonita QuinYou will be asked questions about the traumatic events in your life. You will also be asked about how these events have changed your thoughts, mood, behavior, and ability to function on a daily basis. You may be asked about your use of alcohol or drugs, which can make PTSD symptoms worse. TREATMENT  Unlike many mental disorders, which require lifelong management, PTSD is a curable condition. The goal of PTSD treatment  is to neutralize the negative effects of the traumatic event on daily  functioning, not erase the memory of the event. The following treatments may be prescribed to reach this goal:  Medicines. Certain medicines can reduce some PTSD symptoms. Intrusion symptoms and hyperarousal-overactivity symptoms respond best to medicines.  Counseling (talk therapy). Talk therapy with a mental health professional who is experienced in treating PTSD can help. Talk therapy can provide education, emotional support, and coping skills. Certain types of talk therapy that specifically target the traumatic events are the most effective treatment for PTSD:  Prolonged exposure therapy, which involves remembering and processing the traumatic event with a therapist in a safe environment until it no longer creates a negative emotional response.  Eye movement desensitization and reprocessing therapy, which involves the use of repetitive physical stimulation of the senses that alternates between the right and left sides of the body. It is believed that this therapy facilitates communication between the two sides of the brain. This communication helps the mind to integrate the fragmented memories of the traumatic event into a whole story that makes sense and no longer creates a negative emotional response. Most people with PTSD benefit from a combination of these treatments.    This information is not intended to replace advice given to you by your health care provider. Make sure you discuss any questions you have with your health care provider.   Document Released: 10/31/2000 Document Revised: 02/26/2014 Document Reviewed: 04/24/2012 Elsevier Interactive Patient Education [employer]
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
## 2016-05-16 IMAGING — CT CT HEAD W/O CM
1 series · 1 of 1 positions shown · non-contrast
Comparison: 07/31/2014

CLINICAL DATA: Fall yesterday

EXAM:
CT HEAD WITHOUT CONTRAST
TECHNIQUE: Contiguous axial images were obtained from the base of the skull
through the vertex without intravenous contrast.

[Series 100: scout · sagittal · 0.6mm · 0.68mm/px · 1 of 1 slices shown]
[im 1/1]
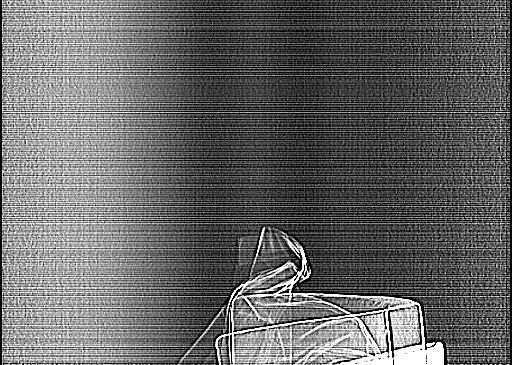

[1 of 1 positions shown; findings below may reference images not displayed]

FINDINGS: No mass effect, midline shift, or acute hemorrhage.
IMPRESSION: Negative.
